# Patient Record
Sex: Female | Born: 1975 | Race: White | Hispanic: No | Marital: Single | Smoking: Current every day smoker
Health system: Southern US, Community
[De-identification: ages and names within clinical notes are randomized; demographics above are authoritative.]

## PROBLEM LIST (undated history)

## (undated) DIAGNOSIS — J40 Bronchitis, not specified as acute or chronic: Secondary | ICD-10-CM

## (undated) DIAGNOSIS — G43909 Migraine, unspecified, not intractable, without status migrainosus: Secondary | ICD-10-CM

## (undated) HISTORY — PX: TUBAL LIGATION: SHX77

## (undated) HISTORY — PX: HAND CARPECTOMY: SHX972

---

## 2009-07-17 ENCOUNTER — Emergency Department (HOSPITAL_BASED_OUTPATIENT_CLINIC_OR_DEPARTMENT_OTHER): Admission: EM | Admit: 2009-07-17 | Discharge: 2009-07-17 | Payer: Self-pay | Admitting: Emergency Medicine

## 2009-07-17 ENCOUNTER — Ambulatory Visit: Payer: Self-pay | Admitting: Interventional Radiology

## 2009-11-15 ENCOUNTER — Emergency Department (HOSPITAL_BASED_OUTPATIENT_CLINIC_OR_DEPARTMENT_OTHER)
Admission: EM | Admit: 2009-11-15 | Discharge: 2009-11-15 | Payer: Self-pay | Source: Home / Self Care | Admitting: Emergency Medicine

## 2012-11-26 ENCOUNTER — Emergency Department (HOSPITAL_BASED_OUTPATIENT_CLINIC_OR_DEPARTMENT_OTHER): Payer: Self-pay

## 2012-11-26 ENCOUNTER — Emergency Department (HOSPITAL_BASED_OUTPATIENT_CLINIC_OR_DEPARTMENT_OTHER)
Admission: EM | Admit: 2012-11-26 | Discharge: 2012-11-26 | Disposition: A | Discharge status: Home / Self Care | Payer: Self-pay | Attending: Emergency Medicine | Admitting: Emergency Medicine

## 2012-11-26 ENCOUNTER — Encounter (HOSPITAL_BASED_OUTPATIENT_CLINIC_OR_DEPARTMENT_OTHER): Payer: Self-pay | Admitting: *Deleted

## 2012-11-26 DIAGNOSIS — J40 Bronchitis, not specified as acute or chronic: Secondary | ICD-10-CM

## 2012-11-26 DIAGNOSIS — J209 Acute bronchitis, unspecified: Secondary | ICD-10-CM | POA: Insufficient documentation

## 2012-11-26 DIAGNOSIS — Z8679 Personal history of other diseases of the circulatory system: Secondary | ICD-10-CM | POA: Insufficient documentation

## 2012-11-26 HISTORY — DX: Migraine, unspecified, not intractable, without status migrainosus: G43.909

## 2012-11-26 HISTORY — DX: Bronchitis, not specified as acute or chronic: J40

## 2012-11-26 MED ORDER — AZITHROMYCIN 250 MG PO TABS: 250.0000 mg | ORAL_TABLET | Freq: Every day | ORAL | Status: AC

## 2012-11-26 NOTE — ED Notes (Signed)
Pt. 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 Pt. Reports she was exposed to excessive second hand smoke.

## 2012-11-26 NOTE — ED Provider Notes (Signed)
Medical screening examination/treatment/procedure(s) were performed by non-physician practitioner and as supervising physician I was immediately available for consultation/collaboration.  Ethelda Chick, MD 11/26/12 1710

## 2012-11-26 NOTE — ED Provider Notes (Signed)
CSN: [id number]     Arrival date & time 11/26/12  1440 History   First MD Initiated Contact with Patient 11/26/12 1500     Chief Complaint  Patient presents with  . Nasal Congestion  . URI   (Consider location/radiation/quality/duration/timing/severity/associated sxs/prior Treatment) Patient is a 37 y.o. female presenting with URI. The history is provided by the patient. No language interpreter was used.  URI Presenting symptoms: congestion, cough and rhinorrhea   Severity:  Moderate Onset quality:  Gradual Timing:  Constant Progression:  Worsening Chronicity:  New Relieved by:  Nothing Worsened by:  Nothing tried Ineffective treatments:  None tried Associated symptoms: no wheezing     Past Medical History  Diagnosis Date  . Migraine   . Bronchitis    Past Surgical History  Procedure Laterality Date  . Hand carpectomy    . Tubal ligation     No family history on file. History  Substance Use Topics  . Smoking status: Not on file  . Smokeless tobacco: Not on file  . Alcohol Use: Not on file   OB History   Grav Para Term Preterm Abortions TAB SAB Ect Mult Living                 Review of Systems  HENT: Positive for congestion and rhinorrhea.   Respiratory: Positive for cough. Negative for wheezing.     Allergies  Review of patient's allergies indicates no known allergies.  Home Medications  No current outpatient prescriptions on file. BP 121/70  Pulse 72  Temp(Src) 98.5 F (36.9 C) (Oral)  Resp 24  Ht 5\' 4"  (1.626 m)  Wt 189 lb (85.73 kg)  BMI 32.43 kg/m2  SpO2 100%  LMP 10/28/2012 Physical Exam  Nursing note and vitals reviewed. Constitutional: She appears well-developed and well-nourished.  HENT:  Head: Normocephalic and atraumatic.  Right Ear: External ear normal.  Left Ear: External ear normal.  Nose: Nose normal.  Mouth/Throat: Oropharynx is clear and moist.  Eyes: Pupils are equal, round, and reactive to light.  Neck: Normal range of  motion. Neck supple.  Cardiovascular: Normal rate.   Pulmonary/Chest: Effort normal.  Abdominal: Soft.  Musculoskeletal: Normal range of motion.  Neurological: She is alert.  Skin: Skin is warm.    ED Course  Procedures (including critical care time) Labs Review Labs Reviewed - No data to display Imaging Review Dg Chest 2 View  11/26/2012   CLINICAL DATA:  Cough and congestion.  EXAM: CHEST  2 VIEW  COMPARISON:  07/17/2009  FINDINGS: Midline trachea. Normal heart size and mediastinal contours. No pleural effusion or pneumothorax. Clear lungs.  IMPRESSION: Normal chest.   Electronically Signed   By: Jeronimo Greaves   On: 11/26/2012 15:58    MDM   1. Bronchitis    zithromax     Elson Areas, PA-C 11/26/12 1646  Lonia Skinner [place], PA-C 11/26/12 1646

## 2012-12-02 ENCOUNTER — Emergency Department (HOSPITAL_BASED_OUTPATIENT_CLINIC_OR_DEPARTMENT_OTHER)
Admission: EM | Admit: 2012-12-02 | Discharge: 2012-12-02 | Disposition: A | Discharge status: Home / Self Care | Payer: Self-pay | Attending: Emergency Medicine | Admitting: Emergency Medicine

## 2012-12-02 ENCOUNTER — Encounter (HOSPITAL_BASED_OUTPATIENT_CLINIC_OR_DEPARTMENT_OTHER): Payer: Self-pay | Admitting: Emergency Medicine

## 2012-12-02 DIAGNOSIS — Z8709 Personal history of other diseases of the respiratory system: Secondary | ICD-10-CM | POA: Insufficient documentation

## 2012-12-02 DIAGNOSIS — R6883 Chills (without fever): Secondary | ICD-10-CM | POA: Insufficient documentation

## 2012-12-02 DIAGNOSIS — R0602 Shortness of breath: Secondary | ICD-10-CM | POA: Insufficient documentation

## 2012-12-02 DIAGNOSIS — R05 Cough: Secondary | ICD-10-CM

## 2012-12-02 DIAGNOSIS — B9789 Other viral agents as the cause of diseases classified elsewhere: Secondary | ICD-10-CM | POA: Insufficient documentation

## 2012-12-02 DIAGNOSIS — Z8679 Personal history of other diseases of the circulatory system: Secondary | ICD-10-CM | POA: Insufficient documentation

## 2012-12-02 DIAGNOSIS — H00016 Hordeolum externum left eye, unspecified eyelid: Secondary | ICD-10-CM

## 2012-12-02 DIAGNOSIS — Z792 Long term (current) use of antibiotics: Secondary | ICD-10-CM | POA: Insufficient documentation

## 2012-12-02 DIAGNOSIS — F17200 Nicotine dependence, unspecified, uncomplicated: Secondary | ICD-10-CM | POA: Insufficient documentation

## 2012-12-02 DIAGNOSIS — B349 Viral infection, unspecified: Secondary | ICD-10-CM

## 2012-12-02 DIAGNOSIS — R220 Localized swelling, mass and lump, head: Secondary | ICD-10-CM | POA: Insufficient documentation

## 2012-12-02 DIAGNOSIS — H00019 Hordeolum externum unspecified eye, unspecified eyelid: Secondary | ICD-10-CM | POA: Insufficient documentation

## 2012-12-02 MED ORDER — HYDROCOD POLST-CHLORPHEN POLST 10-8 MG/5ML PO LQCR: 5.0000 mL | Freq: Two times a day (BID) | ORAL | Status: AC | PRN

## 2012-12-02 MED ORDER — ALBUTEROL SULFATE HFA 108 (90 BASE) MCG/ACT IN AERS
2.0000 | INHALATION_SPRAY | RESPIRATORY_TRACT | Status: DC | PRN
  Administered 2012-12-02: 2 via RESPIRATORY_TRACT
  Filled 2012-12-02: qty 6.7

## 2012-12-02 NOTE — ED Provider Notes (Signed)
CSN: [id number]     Arrival date & time 12/02/12  4098 History   First MD Initiated Contact with Patient 12/02/12 [phone]     Chief Complaint  Patient presents with  . Shortness of Breath  . Cough  . Facial Swelling    L eye   (Consider location/radiation/quality/duration/timing/severity/associated sxs/prior Treatment) HPI Comments: Pt here with cough and sob that is not getting any better:pt state that she was seen a week ago and but on a zpack and it didn't help:pt is a smoker:chills without fever  The history is provided by the patient. No language interpreter was used.    Past Medical History  Diagnosis Date  . Migraine   . Bronchitis    Past Surgical History  Procedure Laterality Date  . Hand carpectomy    . Tubal ligation     History reviewed. No pertinent family history. History  Substance Use Topics  . Smoking status: Current Every Day Smoker -- 0.50 packs/day    Types: Cigarettes  . Smokeless tobacco: Not on file  . Alcohol Use: No   OB History   Grav Para Term Preterm Abortions TAB SAB Ect Mult Living                 Review of Systems  Constitutional: Negative.   Respiratory: Positive for cough and shortness of breath.   Cardiovascular: Negative.     Allergies  Review of patient's allergies indicates no known allergies.  Home Medications   Current Outpatient Rx  Name  Route  Sig  Dispense  Refill  . azithromycin (ZITHROMAX) 250 MG tablet   Oral   Take 1 tablet (250 mg total) by mouth daily. Take first 2 tablets together, then 1 every day until finished.   6 tablet   0   . chlorpheniramine-HYDROcodone (TUSSIONEX PENNKINETIC ER) 10-8 MG/5ML LQCR   Oral   Take 5 mLs by mouth every 12 (twelve) hours as needed.   100 mL   0    BP 125/62  Pulse 79  Temp(Src) 97.9 F (36.6 C) (Oral)  Resp 18  SpO2 100%  LMP 11/28/2012 Physical Exam  Nursing note and vitals reviewed. Constitutional: She is oriented to person, place, and time. She appears  well-developed and well-nourished.  HENT:  Right Ear: External ear normal.  Left Ear: External ear normal.  Mouth/Throat: Posterior oropharyngeal erythema present. No oropharyngeal exudate or posterior oropharyngeal edema.  Eyes: Conjunctivae and EOM are normal.  Neck: Neck supple.  Cardiovascular: Normal rate and regular rhythm.   Pulmonary/Chest: Effort normal and breath sounds normal.  Upper airway congestion  Musculoskeletal: Normal range of motion.  Neurological: She is alert and oriented to person, place, and time.  Skin: Skin is warm and dry.    ED Course  Procedures (including critical care time) Labs Review Labs Reviewed - No data to display Imaging Review No results found.  MDM   1. Stye, left   2. Cough   3. Viral illness    Pt given inhaler and cough syrup:doubt antibiotics needed    Teressa Lower, NP 12/02/12 1029

## 2012-12-02 NOTE — ED Notes (Signed)
Pt reports coming in for same last week. Finished Z pack and symptoms are the same, SOB "has gotten worse"

## 2012-12-03 NOTE — ED Provider Notes (Signed)
Medical screening examination/treatment/procedure(s) were performed by non-physician practitioner and as supervising physician I was immediately available for consultation/collaboration.   Candyce Churn, MD 12/03/12 [phone]

## 2018-02-02 ENCOUNTER — Other Ambulatory Visit: Payer: Self-pay

## 2018-02-02 ENCOUNTER — Emergency Department (HOSPITAL_BASED_OUTPATIENT_CLINIC_OR_DEPARTMENT_OTHER)
Admission: EM | Admit: 2018-02-02 | Discharge: 2018-02-02 | Disposition: A | Discharge status: Home / Self Care | Payer: BLUE CROSS/BLUE SHIELD | Attending: Emergency Medicine | Admitting: Emergency Medicine

## 2018-02-02 ENCOUNTER — Emergency Department (HOSPITAL_BASED_OUTPATIENT_CLINIC_OR_DEPARTMENT_OTHER): Payer: BLUE CROSS/BLUE SHIELD

## 2018-02-02 ENCOUNTER — Encounter (HOSPITAL_BASED_OUTPATIENT_CLINIC_OR_DEPARTMENT_OTHER): Payer: Self-pay | Admitting: Adult Health

## 2018-02-02 DIAGNOSIS — F17210 Nicotine dependence, cigarettes, uncomplicated: Secondary | ICD-10-CM | POA: Diagnosis not present

## 2018-02-02 DIAGNOSIS — M533 Sacrococcygeal disorders, not elsewhere classified: Secondary | ICD-10-CM | POA: Insufficient documentation

## 2018-02-02 DIAGNOSIS — Z79899 Other long term (current) drug therapy: Secondary | ICD-10-CM | POA: Insufficient documentation

## 2018-02-02 DIAGNOSIS — M545 Low back pain: Secondary | ICD-10-CM | POA: Diagnosis present

## 2018-02-02 MED ORDER — HYDROCODONE-ACETAMINOPHEN 5-325 MG PO TABS: 1.0000 | ORAL_TABLET | Freq: Four times a day (QID) | ORAL | 0 refills | Status: AC | PRN

## 2018-02-02 MED ORDER — KETOROLAC TROMETHAMINE 30 MG/ML IJ SOLN
30.0000 mg | Freq: Once | INTRAMUSCULAR | Status: AC
  Administered 2018-02-02: 30 mg via INTRAMUSCULAR
  Filled 2018-02-02: qty 1

## 2018-02-02 MED ORDER — IBUPROFEN 600 MG PO TABS: 600.0000 mg | ORAL_TABLET | Freq: Four times a day (QID) | ORAL | 0 refills | Status: AC | PRN

## 2018-02-02 NOTE — ED Provider Notes (Signed)
[facility] EMERGENCY DEPARTMENT Provider Note   CSN: [id number] Arrival date & time: 02/02/18  1007     History   Chief Complaint Chief Complaint  Patient presents with  . Fall    HPI Alison George is a 42 y.o. female with history of migraine who presents with pain to her buttocks and tailbone after slipping and falling at [facility] last night.  She just tripped and slipped on a wet floor sign, although the floor was not wet.  She did not hit her head or lose consciousness.  She denies any other injuries.  She has significant pain to her tailbone only and a little bit to the right.  She denies any significant hip pain, chest pain, shortness of breath.  She did not take any medications prior to arrival.  She denies any numbness or tingling, urinary symptoms, loss of bowel or bladder control.  HPI  Past Medical History:  Diagnosis Date  . Bronchitis   . Migraine     There are no active problems to display for this patient.   Past Surgical History:  Procedure Laterality Date  . HAND CARPECTOMY    . TUBAL LIGATION       OB History   None      Home Medications    Prior to Admission medications   Medication Sig Start Date End Date Taking? Authorizing Provider  azithromycin (ZITHROMAX) 250 MG tablet Take 1 tablet (250 mg total) by mouth daily. Take first 2 tablets together, then 1 every day until finished. 11/26/12   Elson Areas, PA-C  chlorpheniramine-HYDROcodone (TUSSIONEX PENNKINETIC ER) 10-8 MG/5ML LQCR Take 5 mLs by mouth every 12 (twelve) hours as needed. 12/02/12   Teressa Lower, NP  HYDROcodone-acetaminophen (NORCO/VICODIN) 5-325 MG tablet Take 1-2 tablets by mouth every 6 (six) hours as needed for severe pain. 02/02/18   Reba Hulett, Waylan Boga, PA-C  ibuprofen (ADVIL,MOTRIN) 600 MG tablet Take 1 tablet (600 mg total) by mouth every 6 (six) hours as needed. 02/02/18   Emi Holes, PA-C    Family History History reviewed. No pertinent family  history.  Social History Social History   Tobacco Use  . Smoking status: Current Every Day Smoker    Packs/day: 0.50    Types: Cigarettes  Substance Use Topics  . Alcohol use: No  . Drug use: Yes    Types: Marijuana     Allergies   Patient has no known allergies.   Review of Systems Review of Systems  Genitourinary: Negative for dysuria.  Musculoskeletal: Positive for back pain (sacrum/coccyx area).  Neurological: Negative for syncope and numbness.     Physical Exam Updated Vital Signs BP 110/75   Pulse 87   Temp 97.9 F (36.6 C) (Oral)   Resp 18   Ht 5' 3.5" (1.613 m)   Wt 86.6 kg   LMP 01/30/2018 (Exact Date)   SpO2 97%   BMI 33.28 kg/m   Physical Exam  Constitutional: She appears well-developed and well-nourished. No distress.  HENT:  Head: Normocephalic and atraumatic.  Mouth/Throat: Oropharynx is clear and moist. No oropharyngeal exudate.  Eyes: Pupils are equal, round, and reactive to light. Conjunctivae are normal. Right eye exhibits no discharge. Left eye exhibits no discharge. No scleral icterus.  Neck: Normal range of motion. Neck supple. No thyromegaly present.  Cardiovascular: Normal rate, regular rhythm, normal heart sounds and intact distal pulses. Exam reveals no gallop and no friction rub.  No murmur heard. Pulmonary/Chest: Effort normal and  breath sounds normal. No stridor. No respiratory distress. She has no wheezes. She has no rales.  Abdominal: Soft. Bowel sounds are normal. She exhibits no distension. There is no tenderness. There is no rebound and no guarding.  Musculoskeletal: She exhibits no edema.       Back:  No midline cervical, thoracic, or lumbar tenderness, but significant tenderness over the sacrum and coccyx  Lymphadenopathy:    She has no cervical adenopathy.  Neurological: She is alert. Coordination normal.  Normal sensation and 5/5 strength to bilateral lower extremities  Skin: Skin is warm and dry. No rash noted. She is  not diaphoretic. No pallor.  Psychiatric: She has a normal mood and affect.  Nursing note and vitals reviewed.    ED Treatments / Results  Labs (all labs ordered are listed, but only abnormal results are displayed) Labs Reviewed - No data to display  EKG None  Radiology Dg Pelvis 1-2 Views  Result Date: 02/02/2018 CLINICAL DATA:  Larey SeatFell today with pelvic pain. EXAM: PELVIS - 1-2 VIEW COMPARISON:  None. FINDINGS: No acute or traumatic finding. Transitional lumbosacral anatomy with a transitional articulation on the right. IMPRESSION: No acute or traumatic finding. Electronically Signed   By: Paulina FusiMark  Shogry M.D.   On: 02/02/2018 11:35   Dg Sacrum/coccyx  Result Date: 02/02/2018 CLINICAL DATA:  Larey SeatFell today with sacrococcygeal pain. EXAM: SACRUM AND COCCYX - 2+ VIEW COMPARISON:  None. FINDINGS: There is no evidence of fracture or other focal bone lesions. IMPRESSION: Negative. Electronically Signed   By: Paulina FusiMark  Shogry M.D.   On: 02/02/2018 11:34    Procedures Procedures (including critical care time)  Medications Ordered in ED Medications  ketorolac (TORADOL) 30 MG/ML injection 30 mg (30 mg Intramuscular Given 02/02/18 1034)     Initial Impression / Assessment and Plan / ED Course  I have reviewed the triage vital signs and the nursing notes.  Pertinent labs & imaging results that were available during my care of the patient were reviewed by me and considered in my medical decision making (see chart for details).     Patient presenting with tailbone pain after falling on her buttocks last evening.  X-ray of the sacrum coccyx and pelvis are negative.  Patient given IM Toradol in the ED.  Patient is neurovascularly intact.  Will discharge home with ibuprofen and short course of Norco for severe pain.  Ice recommended.  Donut pillow recommended.  Follow-up to PCP as needed.  Return precautions discussed.  Patient understands and agrees with plan.  Patient vitals stable throughout ED course  and discharged in satisfactory condition.  Final Clinical Impressions(s) / ED Diagnoses   Final diagnoses:  Coccyx pain    ED Discharge Orders         Ordered    ibuprofen (ADVIL,MOTRIN) 600 MG tablet  Every 6 hours PRN     02/02/18 1212    HYDROcodone-acetaminophen (NORCO/VICODIN) 5-325 MG tablet  Every 6 hours PRN     02/02/18 1212           Emi HolesLaw, Amauri Keefe M, PA-C 02/02/18 1238    Terrilee FilesButler, Michael C, MD 02/03/18 [phone]

## 2018-02-02 NOTE — ED Triage Notes (Signed)
Presents post fall on coccyx, unable to sit down. Fall occurred this AM At 2.

## 2018-02-02 NOTE — ED Notes (Signed)
ED Provider at bedside. 

## 2018-02-02 NOTE — Discharge Instructions (Addendum)
Take ibuprofen every 6 hours as needed for your pain.  For severe pain, take 1 to 2 tablets of Norco every 6 hours.  Do not drive or operate machinery while taking this medication.  Use ice 3-4 times daily alternating 20 minutes on, 20 minutes off.  Recommend buying a donut pillow.  Please return the emergency department develop any new or worsening symptoms.  Do not drink alcohol, drive, operate machinery or participate in any other potentially dangerous activities while taking opiate pain medication as it may make you sleepy. Do not take this medication with any other sedating medications, either prescription or over-the-counter. If you were prescribed Percocet or Vicodin, do not take these with acetaminophen (Tylenol) as it is already contained within these medications and overdose of Tylenol is dangerous.   This medication is an opiate (or narcotic) pain medication and can be habit forming.  Use it as little as possible to achieve adequate pain control.  Do not use or use it with extreme caution if you have a history of opiate abuse or dependence. This medication is intended for your use only - do not give any to anyone else and keep it in a secure place where nobody else, especially children, have access to it. It will also cause or worsen constipation, so you may want to consider taking an over-the-counter stool softener while you are taking this medication.

## 2019-06-11 IMAGING — DX DG SACRUM/COCCYX 2+V
3 series · 3 of 3 positions shown · non-contrast
Comparison: None.

CLINICAL DATA: Fell today with sacrococcygeal pain.

EXAM:
SACRUM AND COCCYX - 2+ VIEW

[coccyx ap]
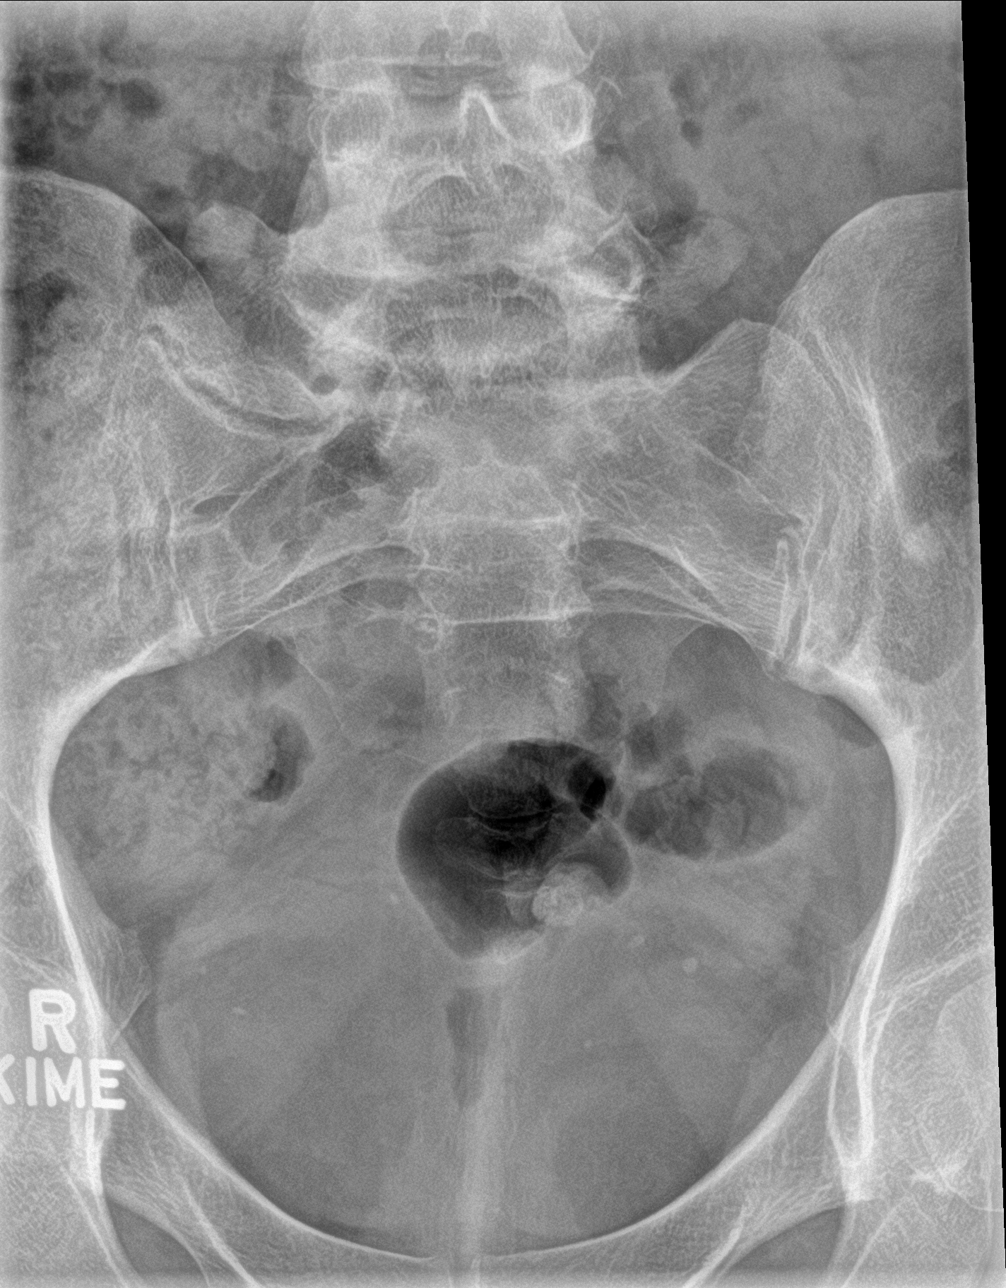

[sacrum ap]
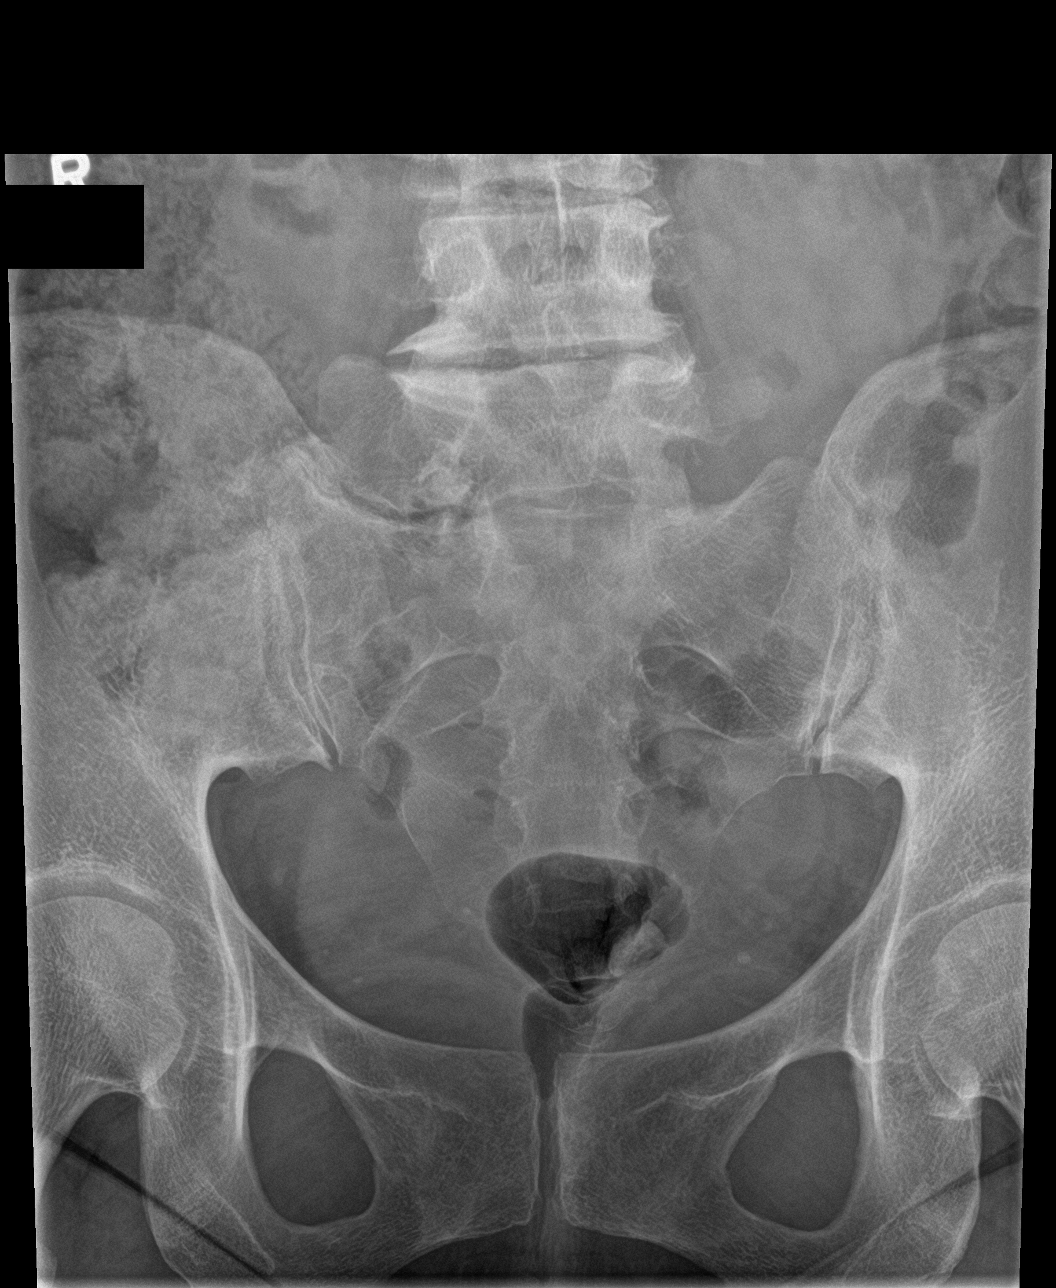

[sacrum lat]
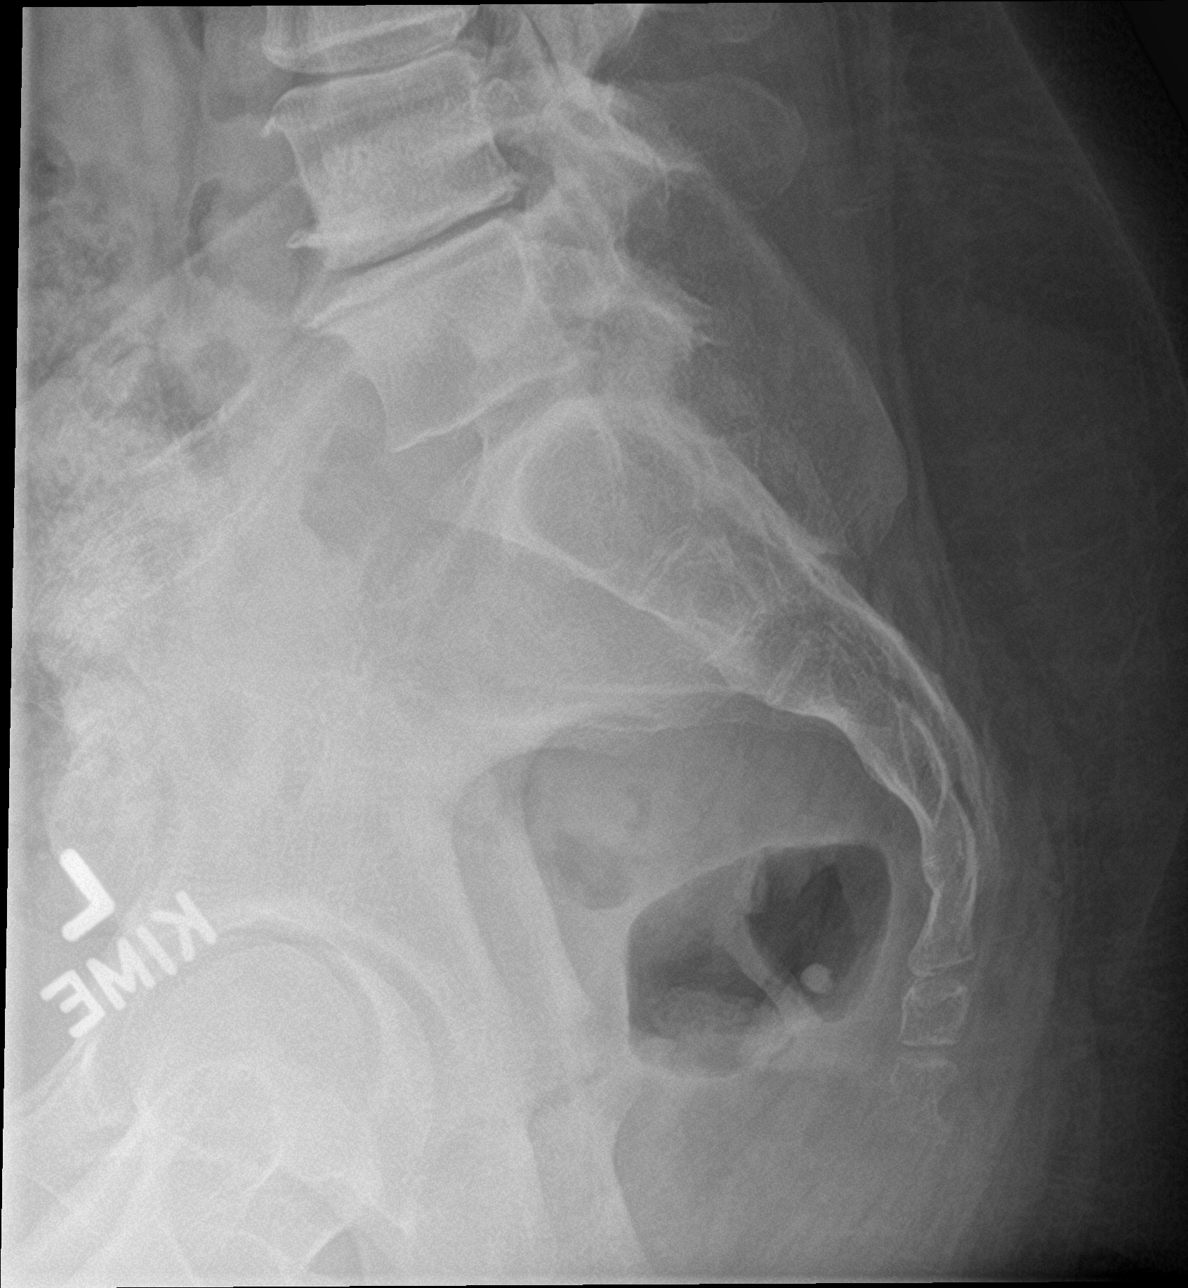

[3 of 3 positions shown; findings below may reference images not displayed]

FINDINGS: There is no evidence of fracture or other focal bone lesions.
IMPRESSION: Negative.
